# Patient Record
Sex: Female | Born: 2000 | Race: White | Hispanic: No | Marital: Single | Smoking: Never smoker
Health system: Southern US, Community
[De-identification: ages and names within clinical notes are randomized; demographics above are authoritative.]

## PROBLEM LIST (undated history)

## (undated) DIAGNOSIS — J45909 Unspecified asthma, uncomplicated: Secondary | ICD-10-CM

## (undated) DIAGNOSIS — M329 Systemic lupus erythematosus, unspecified: Secondary | ICD-10-CM

## (undated) DIAGNOSIS — IMO0002 Reserved for concepts with insufficient information to code with codable children: Secondary | ICD-10-CM

## (undated) HISTORY — DX: Unspecified asthma, uncomplicated: J45.909

## (undated) HISTORY — DX: Systemic lupus erythematosus, unspecified: M32.9

## (undated) HISTORY — DX: Reserved for concepts with insufficient information to code with codable children: IMO0002

---
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
## 2018-01-11 ENCOUNTER — Encounter: Payer: Self-pay | Admitting: Physical Therapy

## 2018-01-11 ENCOUNTER — Ambulatory Visit: Payer: BLUE CROSS/BLUE SHIELD | Attending: Orthopaedic Surgery | Admitting: Physical Therapy

## 2018-01-11 ENCOUNTER — Other Ambulatory Visit: Payer: Self-pay

## 2018-01-11 DIAGNOSIS — M25612 Stiffness of left shoulder, not elsewhere classified: Secondary | ICD-10-CM

## 2018-01-11 DIAGNOSIS — M25662 Stiffness of left knee, not elsewhere classified: Secondary | ICD-10-CM | POA: Insufficient documentation

## 2018-01-11 DIAGNOSIS — R29898 Other symptoms and signs involving the musculoskeletal system: Secondary | ICD-10-CM | POA: Insufficient documentation

## 2018-01-11 DIAGNOSIS — M25512 Pain in left shoulder: Secondary | ICD-10-CM | POA: Insufficient documentation

## 2018-01-11 DIAGNOSIS — R262 Difficulty in walking, not elsewhere classified: Secondary | ICD-10-CM | POA: Insufficient documentation

## 2018-01-11 DIAGNOSIS — M542 Cervicalgia: Secondary | ICD-10-CM | POA: Diagnosis present

## 2018-01-11 DIAGNOSIS — M25562 Pain in left knee: Secondary | ICD-10-CM | POA: Insufficient documentation
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
## 2018-01-17 ENCOUNTER — Ambulatory Visit: Payer: BLUE CROSS/BLUE SHIELD

## 2018-01-20 ENCOUNTER — Ambulatory Visit: Payer: BLUE CROSS/BLUE SHIELD | Attending: Orthopaedic Surgery | Admitting: Physical Therapy

## 2018-01-20 ENCOUNTER — Encounter: Payer: Self-pay | Admitting: Physical Therapy

## 2018-01-20 DIAGNOSIS — M25612 Stiffness of left shoulder, not elsewhere classified: Secondary | ICD-10-CM | POA: Insufficient documentation

## 2018-01-20 DIAGNOSIS — M542 Cervicalgia: Secondary | ICD-10-CM | POA: Insufficient documentation

## 2018-01-20 DIAGNOSIS — M25662 Stiffness of left knee, not elsewhere classified: Secondary | ICD-10-CM | POA: Insufficient documentation

## 2018-01-20 DIAGNOSIS — R29898 Other symptoms and signs involving the musculoskeletal system: Secondary | ICD-10-CM

## 2018-01-20 DIAGNOSIS — R262 Difficulty in walking, not elsewhere classified: Secondary | ICD-10-CM | POA: Insufficient documentation

## 2018-01-20 DIAGNOSIS — M25512 Pain in left shoulder: Secondary | ICD-10-CM | POA: Insufficient documentation

## 2018-01-20 DIAGNOSIS — M25562 Pain in left knee: Secondary | ICD-10-CM | POA: Insufficient documentation
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
## 2018-01-21 ENCOUNTER — Telehealth (INDEPENDENT_AMBULATORY_CARE_PROVIDER_SITE_OTHER): Payer: Self-pay | Admitting: Orthopaedic Surgery

## 2018-01-21 NOTE — Telephone Encounter (Signed)
Pt is stating that she can't go back to work and pick up children ect. 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

## 2018-01-24 NOTE — Telephone Encounter (Signed)
Note ready at front desk for pick up. I left voicemail for patient advising.
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
## 2018-01-24 NOTE — Telephone Encounter (Signed)
Ok for 1 month?

## 2018-01-26 ENCOUNTER — Ambulatory Visit: Payer: BLUE CROSS/BLUE SHIELD

## 2018-01-26 DIAGNOSIS — M25662 Stiffness of left knee, not elsewhere classified: Secondary | ICD-10-CM

## 2018-01-26 DIAGNOSIS — M25562 Pain in left knee: Secondary | ICD-10-CM

## 2018-01-26 DIAGNOSIS — R29898 Other symptoms and signs involving the musculoskeletal system: Secondary | ICD-10-CM

## 2018-01-26 DIAGNOSIS — M25512 Pain in left shoulder: Secondary | ICD-10-CM

## 2018-01-26 DIAGNOSIS — R262 Difficulty in walking, not elsewhere classified: Secondary | ICD-10-CM

## 2018-01-26 DIAGNOSIS — M542 Cervicalgia: Secondary | ICD-10-CM

## 2018-01-26 DIAGNOSIS — M25612 Stiffness of left shoulder, not elsewhere classified: Secondary | ICD-10-CM
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
## 2018-01-27 ENCOUNTER — Encounter: Payer: BLUE CROSS/BLUE SHIELD | Admitting: Physical Therapy

## 2018-01-28 ENCOUNTER — Ambulatory Visit: Payer: BLUE CROSS/BLUE SHIELD | Admitting: Physical Therapy

## 2018-02-01 ENCOUNTER — Ambulatory Visit: Payer: BLUE CROSS/BLUE SHIELD

## 2018-02-01 DIAGNOSIS — M25562 Pain in left knee: Secondary | ICD-10-CM

## 2018-02-01 DIAGNOSIS — M25512 Pain in left shoulder: Secondary | ICD-10-CM

## 2018-02-01 DIAGNOSIS — R29898 Other symptoms and signs involving the musculoskeletal system: Secondary | ICD-10-CM

## 2018-02-01 DIAGNOSIS — M25662 Stiffness of left knee, not elsewhere classified: Secondary | ICD-10-CM

## 2018-02-01 DIAGNOSIS — R262 Difficulty in walking, not elsewhere classified: Secondary | ICD-10-CM

## 2018-02-01 DIAGNOSIS — M25612 Stiffness of left shoulder, not elsewhere classified: Secondary | ICD-10-CM

## 2018-02-01 DIAGNOSIS — M542 Cervicalgia: Secondary | ICD-10-CM
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
## 2018-02-07 ENCOUNTER — Encounter: Payer: Self-pay | Admitting: Physical Therapy

## 2018-02-07 ENCOUNTER — Ambulatory Visit: Payer: BLUE CROSS/BLUE SHIELD | Admitting: Physical Therapy

## 2018-02-07 DIAGNOSIS — M542 Cervicalgia: Secondary | ICD-10-CM

## 2018-02-07 DIAGNOSIS — M25662 Stiffness of left knee, not elsewhere classified: Secondary | ICD-10-CM

## 2018-02-07 DIAGNOSIS — M25612 Stiffness of left shoulder, not elsewhere classified: Secondary | ICD-10-CM

## 2018-02-07 DIAGNOSIS — R262 Difficulty in walking, not elsewhere classified: Secondary | ICD-10-CM

## 2018-02-07 DIAGNOSIS — M25562 Pain in left knee: Secondary | ICD-10-CM

## 2018-02-07 DIAGNOSIS — M25512 Pain in left shoulder: Secondary | ICD-10-CM

## 2018-02-07 DIAGNOSIS — R29898 Other symptoms and signs involving the musculoskeletal system: Secondary | ICD-10-CM
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
## 2018-02-09 ENCOUNTER — Telehealth (INDEPENDENT_AMBULATORY_CARE_PROVIDER_SITE_OTHER): Payer: Self-pay | Admitting: Orthopaedic Surgery

## 2018-02-09 ENCOUNTER — Ambulatory Visit: Payer: BLUE CROSS/BLUE SHIELD | Admitting: Physical Therapy

## 2018-02-09 ENCOUNTER — Encounter: Payer: Self-pay | Admitting: Physical Therapy

## 2018-02-09 DIAGNOSIS — R262 Difficulty in walking, not elsewhere classified: Secondary | ICD-10-CM

## 2018-02-09 DIAGNOSIS — M542 Cervicalgia: Secondary | ICD-10-CM

## 2018-02-09 DIAGNOSIS — M25562 Pain in left knee: Secondary | ICD-10-CM

## 2018-02-09 DIAGNOSIS — M25662 Stiffness of left knee, not elsewhere classified: Secondary | ICD-10-CM

## 2018-02-09 DIAGNOSIS — M25612 Stiffness of left shoulder, not elsewhere classified: Secondary | ICD-10-CM

## 2018-02-09 DIAGNOSIS — R29898 Other symptoms and signs involving the musculoskeletal system: Secondary | ICD-10-CM

## 2018-02-09 DIAGNOSIS — M25512 Pain in left shoulder: Secondary | ICD-10-CM

## 2018-02-09 NOTE — Telephone Encounter (Signed)
Please advise 

## 2018-02-09 NOTE — Telephone Encounter (Signed)
Patient called stated that she has completed 6 sessions of PT and she is not feeling any relief it actually about the same pain she had prior to starting.  Please call patient to advise. She was stating she thinks having a MRI would be the next thing to do as that was discussed by the nurse.  [phone]
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
## 2018-02-09 NOTE — Telephone Encounter (Signed)
MRI of which body part?

## 2018-02-10 NOTE — Telephone Encounter (Signed)
LMOM for patient to call back with body part

## 2018-02-14 ENCOUNTER — Encounter: Payer: Self-pay | Admitting: Physical Therapy

## 2018-02-14 ENCOUNTER — Ambulatory Visit: Payer: BLUE CROSS/BLUE SHIELD | Attending: Orthopaedic Surgery | Admitting: Physical Therapy

## 2018-02-14 DIAGNOSIS — R29898 Other symptoms and signs involving the musculoskeletal system: Secondary | ICD-10-CM

## 2018-02-14 DIAGNOSIS — M25512 Pain in left shoulder: Secondary | ICD-10-CM

## 2018-02-14 DIAGNOSIS — M25662 Stiffness of left knee, not elsewhere classified: Secondary | ICD-10-CM

## 2018-02-14 DIAGNOSIS — M25562 Pain in left knee: Secondary | ICD-10-CM | POA: Insufficient documentation

## 2018-02-14 DIAGNOSIS — M542 Cervicalgia: Secondary | ICD-10-CM | POA: Insufficient documentation

## 2018-02-14 DIAGNOSIS — R262 Difficulty in walking, not elsewhere classified: Secondary | ICD-10-CM | POA: Insufficient documentation

## 2018-02-14 DIAGNOSIS — M25612 Stiffness of left shoulder, not elsewhere classified: Secondary | ICD-10-CM | POA: Insufficient documentation
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
## 2018-02-18 ENCOUNTER — Ambulatory Visit: Payer: BLUE CROSS/BLUE SHIELD

## 2018-02-23 ENCOUNTER — Telehealth (INDEPENDENT_AMBULATORY_CARE_PROVIDER_SITE_OTHER): Payer: Self-pay | Admitting: Orthopaedic Surgery

## 2018-02-23 ENCOUNTER — Ambulatory Visit (INDEPENDENT_AMBULATORY_CARE_PROVIDER_SITE_OTHER): Payer: BLUE CROSS/BLUE SHIELD | Admitting: Orthopaedic Surgery

## 2018-02-23 ENCOUNTER — Other Ambulatory Visit (INDEPENDENT_AMBULATORY_CARE_PROVIDER_SITE_OTHER): Payer: Self-pay

## 2018-02-23 DIAGNOSIS — M25562 Pain in left knee: Secondary | ICD-10-CM

## 2018-02-23 NOTE — Telephone Encounter (Signed)
MRI order made.   

## 2018-02-23 NOTE — Telephone Encounter (Signed)
Pt called asking about her MRI and if it is ready to be scheduled.

## 2018-02-23 NOTE — Telephone Encounter (Signed)
See message from patient, do you want to still see her and re exam?  Or do you want to just order MRI scan, then followup?

## 2018-02-23 NOTE — Telephone Encounter (Signed)
IC patient and advised, we will cancel today's appt and she can be seen to review MRI scan, she understands.  Need to enter order for MRI.

## 2018-02-23 NOTE — Telephone Encounter (Signed)
SEE OTHER MESSAGE

## 2018-02-23 NOTE — Telephone Encounter (Signed)
Patient called stating that she did call back to let the Dr. Roda Shutters know that it was her left knee that is still bothering her and that she thought an MRI would be the next step since PT is not helping her.  She wanted to know if she needed to come to the appointment today since she has not had the MRI yet.  CB#[phone].  Thank you.

## 2018-02-23 NOTE — Telephone Encounter (Signed)
Called patient to let her know. Someone will call her to schedule appt.

## 2018-02-23 NOTE — Telephone Encounter (Signed)
No she can reschedule until we get the MRI.  Go ahead and order the MRI please.  Thanks.

## 2018-02-24 ENCOUNTER — Encounter: Payer: Self-pay | Admitting: Physical Therapy

## 2018-02-24 ENCOUNTER — Ambulatory Visit: Payer: BLUE CROSS/BLUE SHIELD | Admitting: Physical Therapy

## 2018-02-24 DIAGNOSIS — M542 Cervicalgia: Secondary | ICD-10-CM

## 2018-02-24 DIAGNOSIS — M25612 Stiffness of left shoulder, not elsewhere classified: Secondary | ICD-10-CM

## 2018-02-24 DIAGNOSIS — M25662 Stiffness of left knee, not elsewhere classified: Secondary | ICD-10-CM

## 2018-02-24 DIAGNOSIS — R262 Difficulty in walking, not elsewhere classified: Secondary | ICD-10-CM

## 2018-02-24 DIAGNOSIS — M25562 Pain in left knee: Secondary | ICD-10-CM

## 2018-02-24 DIAGNOSIS — R29898 Other symptoms and signs involving the musculoskeletal system: Secondary | ICD-10-CM

## 2018-02-24 DIAGNOSIS — M25512 Pain in left shoulder: Secondary | ICD-10-CM
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
## 2018-03-01 ENCOUNTER — Ambulatory Visit: Payer: BLUE CROSS/BLUE SHIELD | Admitting: Physical Therapy

## 2018-03-01 ENCOUNTER — Encounter: Payer: Self-pay | Admitting: Physical Therapy

## 2018-03-01 ENCOUNTER — Telehealth (INDEPENDENT_AMBULATORY_CARE_PROVIDER_SITE_OTHER): Payer: Self-pay | Admitting: Orthopaedic Surgery

## 2018-03-01 DIAGNOSIS — M25662 Stiffness of left knee, not elsewhere classified: Secondary | ICD-10-CM

## 2018-03-01 DIAGNOSIS — M25512 Pain in left shoulder: Secondary | ICD-10-CM

## 2018-03-01 DIAGNOSIS — M542 Cervicalgia: Secondary | ICD-10-CM

## 2018-03-01 DIAGNOSIS — M25562 Pain in left knee: Secondary | ICD-10-CM

## 2018-03-01 DIAGNOSIS — R29898 Other symptoms and signs involving the musculoskeletal system: Secondary | ICD-10-CM

## 2018-03-01 DIAGNOSIS — R262 Difficulty in walking, not elsewhere classified: Secondary | ICD-10-CM

## 2018-03-01 DIAGNOSIS — M25612 Stiffness of left shoulder, not elsewhere classified: Secondary | ICD-10-CM

## 2018-03-01 NOTE — Telephone Encounter (Signed)
Ok that's fine
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
## 2018-03-01 NOTE — Telephone Encounter (Signed)
Pt called saying she needs a new note for work she is still experiencing some pain   and she doesn't have her MRI till the 26th of feb

## 2018-03-01 NOTE — Telephone Encounter (Signed)
See message.

## 2018-03-02 ENCOUNTER — Ambulatory Visit (INDEPENDENT_AMBULATORY_CARE_PROVIDER_SITE_OTHER): Payer: BLUE CROSS/BLUE SHIELD | Admitting: Orthopaedic Surgery

## 2018-03-04 ENCOUNTER — Ambulatory Visit: Payer: BLUE CROSS/BLUE SHIELD

## 2018-03-04 NOTE — Telephone Encounter (Signed)
I tried calling patient to discuss what she was needing note to cover. LM for her to call back to discuss.

## 2018-03-07 ENCOUNTER — Ambulatory Visit: Payer: BLUE CROSS/BLUE SHIELD | Admitting: Physical Therapy

## 2018-03-09 ENCOUNTER — Ambulatory Visit
Admission: RE | Admit: 2018-03-09 | Discharge: 2018-03-09 | Disposition: A | Discharge status: Home / Self Care | Payer: Self-pay | Source: Ambulatory Visit | Attending: Orthopaedic Surgery | Admitting: Orthopaedic Surgery

## 2018-03-09 DIAGNOSIS — M25562 Pain in left knee: Secondary | ICD-10-CM

## 2018-03-09 NOTE — Progress Notes (Signed)
MRI is completely normal.

## 2018-03-10 ENCOUNTER — Telehealth (INDEPENDENT_AMBULATORY_CARE_PROVIDER_SITE_OTHER): Payer: Self-pay

## 2018-03-10 NOTE — Telephone Encounter (Signed)
Patient returned your call.  CB#[phone].  Thank you.

## 2018-03-10 NOTE — Telephone Encounter (Signed)
Tarry Kos, MD  Albertina Parr, RMA        MRI is completely normal     Called patient no answer. LMOM to return our call.

## 2018-03-10 NOTE — Telephone Encounter (Signed)
Called to advise patient MRI was Normal.

## 2018-03-15 ENCOUNTER — Encounter: Payer: Self-pay | Admitting: Physical Therapy

## 2018-03-15 ENCOUNTER — Ambulatory Visit: Payer: BLUE CROSS/BLUE SHIELD | Attending: Orthopaedic Surgery | Admitting: Physical Therapy

## 2018-03-15 DIAGNOSIS — M25512 Pain in left shoulder: Secondary | ICD-10-CM

## 2018-03-15 DIAGNOSIS — R29898 Other symptoms and signs involving the musculoskeletal system: Secondary | ICD-10-CM

## 2018-03-15 DIAGNOSIS — M25562 Pain in left knee: Secondary | ICD-10-CM | POA: Diagnosis not present

## 2018-03-15 DIAGNOSIS — M542 Cervicalgia: Secondary | ICD-10-CM | POA: Insufficient documentation

## 2018-03-15 DIAGNOSIS — R262 Difficulty in walking, not elsewhere classified: Secondary | ICD-10-CM | POA: Diagnosis present

## 2018-03-15 DIAGNOSIS — M25662 Stiffness of left knee, not elsewhere classified: Secondary | ICD-10-CM | POA: Insufficient documentation

## 2018-03-15 DIAGNOSIS — M25612 Stiffness of left shoulder, not elsewhere classified: Secondary | ICD-10-CM
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
## 2018-03-15 NOTE — Patient Instructions (Signed)

## 2018-03-22 ENCOUNTER — Ambulatory Visit: Payer: BLUE CROSS/BLUE SHIELD | Admitting: Physical Therapy

## 2018-03-23 ENCOUNTER — Encounter: Payer: Self-pay | Admitting: Physical Therapy

## 2018-03-23 ENCOUNTER — Other Ambulatory Visit: Payer: Self-pay

## 2018-03-23 ENCOUNTER — Ambulatory Visit: Payer: BLUE CROSS/BLUE SHIELD | Admitting: Physical Therapy

## 2018-03-23 DIAGNOSIS — M25612 Stiffness of left shoulder, not elsewhere classified: Secondary | ICD-10-CM

## 2018-03-23 DIAGNOSIS — M542 Cervicalgia: Secondary | ICD-10-CM

## 2018-03-23 DIAGNOSIS — M25512 Pain in left shoulder: Secondary | ICD-10-CM

## 2018-03-23 DIAGNOSIS — M25662 Stiffness of left knee, not elsewhere classified: Secondary | ICD-10-CM

## 2018-03-23 DIAGNOSIS — M25562 Pain in left knee: Secondary | ICD-10-CM | POA: Diagnosis not present

## 2018-03-23 DIAGNOSIS — R262 Difficulty in walking, not elsewhere classified: Secondary | ICD-10-CM

## 2018-03-23 DIAGNOSIS — R29898 Other symptoms and signs involving the musculoskeletal system: Secondary | ICD-10-CM
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
## 2018-03-28 ENCOUNTER — Other Ambulatory Visit: Payer: Self-pay

## 2018-03-28 ENCOUNTER — Encounter: Payer: Self-pay | Admitting: Physical Therapy

## 2018-03-28 ENCOUNTER — Telehealth (INDEPENDENT_AMBULATORY_CARE_PROVIDER_SITE_OTHER): Payer: Self-pay | Admitting: Orthopaedic Surgery

## 2018-03-28 ENCOUNTER — Ambulatory Visit: Payer: BLUE CROSS/BLUE SHIELD | Admitting: Physical Therapy

## 2018-03-28 ENCOUNTER — Ambulatory Visit (INDEPENDENT_AMBULATORY_CARE_PROVIDER_SITE_OTHER): Payer: BLUE CROSS/BLUE SHIELD | Admitting: Orthopaedic Surgery

## 2018-03-28 ENCOUNTER — Encounter (INDEPENDENT_AMBULATORY_CARE_PROVIDER_SITE_OTHER): Payer: Self-pay | Admitting: Orthopaedic Surgery

## 2018-03-28 VITALS — BP 103/77 | HR 90 | Ht 63.5 in | Wt 165.0 lb

## 2018-03-28 DIAGNOSIS — R262 Difficulty in walking, not elsewhere classified: Secondary | ICD-10-CM

## 2018-03-28 DIAGNOSIS — M542 Cervicalgia: Secondary | ICD-10-CM

## 2018-03-28 DIAGNOSIS — M25562 Pain in left knee: Secondary | ICD-10-CM | POA: Diagnosis not present

## 2018-03-28 DIAGNOSIS — M25662 Stiffness of left knee, not elsewhere classified: Secondary | ICD-10-CM

## 2018-03-28 DIAGNOSIS — G8929 Other chronic pain: Secondary | ICD-10-CM | POA: Diagnosis not present

## 2018-03-28 DIAGNOSIS — M25512 Pain in left shoulder: Secondary | ICD-10-CM

## 2018-03-28 DIAGNOSIS — R29898 Other symptoms and signs involving the musculoskeletal system: Secondary | ICD-10-CM

## 2018-03-28 DIAGNOSIS — M25612 Stiffness of left shoulder, not elsewhere classified: Secondary | ICD-10-CM

## 2018-03-28 MED ORDER — DICLOFENAC SODIUM 1 % TD GEL: 4.0000 g | Freq: Four times a day (QID) | TRANSDERMAL | 4 refills | Status: DC

## 2018-03-28 NOTE — Telephone Encounter (Signed)
Patient came in today looking to pick up a out of work note from a message she left on 03/01/18. She states she was out of work from 02/24/18 until after her mri. Please call patient asap.

## 2018-03-28 NOTE — Telephone Encounter (Signed)
Is this [facility] note okay to do for the following dates?

## 2018-03-28 NOTE — Telephone Encounter (Signed)
yes
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
## 2018-03-29 ENCOUNTER — Encounter (INDEPENDENT_AMBULATORY_CARE_PROVIDER_SITE_OTHER): Payer: Self-pay

## 2018-03-29 NOTE — Telephone Encounter (Signed)
Called patient no answer LMOM note ready for pick up at the front desk.

## 2018-03-31 ENCOUNTER — Telehealth (INDEPENDENT_AMBULATORY_CARE_PROVIDER_SITE_OTHER): Payer: Self-pay | Admitting: Orthopaedic Surgery

## 2018-03-31 NOTE — Telephone Encounter (Signed)
Patient left a voicemail stating she received a call from her pharmacy who told her the Voltaren Gel needs a prior authorization before they will fill it.

## 2018-04-04 ENCOUNTER — Ambulatory Visit: Payer: BLUE CROSS/BLUE SHIELD

## 2018-04-08 NOTE — Telephone Encounter (Signed)
Called patient. No answer. Left message that insurance will not cover Voltaren Gel. I recommended that she use the GoodRx to get it and find it cheaper.

## 2018-04-11 ENCOUNTER — Encounter: Payer: BLUE CROSS/BLUE SHIELD | Admitting: Physical Therapy

## 2018-05-09 ENCOUNTER — Ambulatory Visit (INDEPENDENT_AMBULATORY_CARE_PROVIDER_SITE_OTHER): Payer: BLUE CROSS/BLUE SHIELD | Admitting: Orthopaedic Surgery

## 2018-08-16 ENCOUNTER — Telehealth: Payer: Self-pay | Admitting: Orthopaedic Surgery

## 2018-08-16 NOTE — Telephone Encounter (Signed)
Patient called stating she was attending physical therapy until their office closed due to [place].  Patient states she called to reschedule the appointments and was told she needed a new referral.  Patient is requesting a return call.

## 2018-08-17 ENCOUNTER — Other Ambulatory Visit: Payer: Self-pay | Admitting: Orthopaedic Surgery

## 2018-08-17 DIAGNOSIS — M25562 Pain in left knee: Secondary | ICD-10-CM

## 2018-08-17 DIAGNOSIS — G8929 Other chronic pain: Secondary | ICD-10-CM

## 2018-08-17 NOTE — Telephone Encounter (Signed)
Ok to renew referral?

## 2018-08-17 NOTE — Telephone Encounter (Signed)
Order has been entered and sent.

## 2018-08-17 NOTE — Telephone Encounter (Signed)
Are you okay with an order for PT for her left knee? It was originally ordered by Dr.Xu, but now patient is under your care for her left knee. Thanks!

## 2020-04-24 ENCOUNTER — Ambulatory Visit: Payer: BLUE CROSS/BLUE SHIELD | Admitting: Family Medicine

## 2020-05-08 ENCOUNTER — Encounter: Payer: Self-pay | Admitting: Family Medicine

## 2020-05-08 ENCOUNTER — Other Ambulatory Visit: Payer: Self-pay

## 2020-05-08 ENCOUNTER — Ambulatory Visit: Payer: 59 | Admitting: Family Medicine

## 2020-05-08 VITALS — BP 108/62 | HR 88 | Temp 98.0°F | Ht 63.5 in | Wt 216.0 lb

## 2020-05-08 DIAGNOSIS — Z0000 Encounter for general adult medical examination without abnormal findings: Secondary | ICD-10-CM

## 2020-05-08 DIAGNOSIS — J4520 Mild intermittent asthma, uncomplicated: Secondary | ICD-10-CM | POA: Diagnosis not present

## 2020-05-08 DIAGNOSIS — Z114 Encounter for screening for human immunodeficiency virus [HIV]: Secondary | ICD-10-CM | POA: Diagnosis not present

## 2020-05-08 DIAGNOSIS — Z1159 Encounter for screening for other viral diseases: Secondary | ICD-10-CM | POA: Diagnosis not present

## 2020-05-08 DIAGNOSIS — M329 Systemic lupus erythematosus, unspecified: Secondary | ICD-10-CM | POA: Diagnosis not present

## 2020-05-08 DIAGNOSIS — R635 Abnormal weight gain: Secondary | ICD-10-CM

## 2020-05-08 MED ORDER — ALBUTEROL SULFATE HFA 108 (90 BASE) MCG/ACT IN AERS: 2.0000 | INHALATION_SPRAY | Freq: Four times a day (QID) | RESPIRATORY_TRACT | 1 refills | Status: AC | PRN

## 2020-05-08 NOTE — Progress Notes (Signed)
Chief Complaint  Patient presents with  . New Patient (Initial Visit)     Well Woman Laura Warner is here for a complete physical.   Her last physical was >1 year ago.  Current diet: in general, a "healthy" diet. Current exercise: cardio, lifting weights. Fatigue out of ordinary? No Seatbelt? Yes Loss of interested in doing things or depression in past 2 weeks? No  Health Maintenance Pap/HPV- No Tetanus- Yes HIV screening- No Hep C screening- No  Patient has a history of weight gain over the past several years.  She has gained around 30 pounds from her regular weight.  Her diet is largely unchanged and remains healthy.  She exercises routinely lifting weights 2 days a week and doing cardio several days a week between.  Her brother has a history of thyroid disease.  She has never been tested.  The patient has a history of mild intermittent asthma.  She uses albuterol as needed.  Winter seems to be her worst time of the year where she uses her rescue inhaler around twice per week. Past Medical History:  Diagnosis Date  . Asthma   . Lupus [facility])      Past Surgical History:  Procedure Laterality Date  . TONSILLECTOMY AND ADENOIDECTOMY  2013  . wisdom  2018    Medications  Current Outpatient Medications on File Prior to Visit  Medication Sig Dispense Refill  . desonide (DESOWEN) 0.05 % cream Apply 1 application topically 2 (two) times daily.    . hydroxychloroquine (PLAQUENIL) 200 MG tablet Take 200 mg by mouth daily.    . hydrOXYzine (ATARAX/VISTARIL) 25 MG tablet Take 1 tablet by mouth daily as needed.    . tacrolimus (PROTOPIC) 0.03 % ointment Apply topically 2 (two) times daily.    Marland Kitchen triamcinolone cream (KENALOG) 0.1 % Apply topically 2 (two) times daily.     Allergies No Known Allergies  Review of Systems: Constitutional:  +wt gain Eye:  no recent significant change in vision Ear/Nose/Mouth/Throat:  Ears:  no tinnitus or vertigo and no recent change in  hearing Nose/Mouth/Throat:  no complaints of nasal congestion, no sore throat Cardiovascular: no chest pain Respiratory:  no cough and no shortness of breath Gastrointestinal:  no abdominal pain, no change in bowel habits GU:  Female: negative for dysuria or pelvic pain Musculoskeletal/Extremities:  no pain of the joints Integumentary (Skin/Breast):  no abnormal skin lesions reported Neurologic:  no headaches Endocrine:  denies fatigue Hematologic/Lymphatic:  No areas of easy bleeding  Exam BP 108/62 (BP Location: Left Arm, Patient Position: Sitting, Cuff Size: Normal)   Pulse 88   Temp 98 F (36.7 C) (Oral)   Ht 5' 3.5" (1.613 m)   Wt 216 lb (98 kg)   SpO2 99%   BMI 37.66 kg/m  General:  well developed, well nourished, in no apparent distress Skin:  no significant moles, warts, or growths Head:  no masses, lesions, or tenderness Eyes:  pupils equal and round, sclera anicteric without injection Ears:  canals without lesions, TMs shiny without retraction, no obvious effusion, no erythema Nose:  nares patent, septum midline, mucosa normal, and no drainage or sinus tenderness Throat/Pharynx:  lips and gingiva without lesion; tongue and uvula midline; non-inflamed pharynx; no exudates or postnasal drainage Neck: neck supple without adenopathy, thyromegaly, or masses Lungs:  clear to auscultation, breath sounds equal bilaterally, no respiratory distress Cardio:  regular rate and rhythm, no bruits, no LE edema Abdomen:  abdomen soft, nontender; bowel sounds normal; no  masses or organomegaly Genital: Defer to GYN Musculoskeletal:  symmetrical muscle groups noted without atrophy or deformity Extremities:  no clubbing, cyanosis, or edema, no deformities, no skin discoloration Neuro:  gait normal; deep tendon reflexes normal and symmetric Psych: well oriented with normal range of affect and appropriate judgment/insight  Assessment and Plan  Well adult exam - Plan: CBC, Comprehensive  metabolic panel, Lipid panel  Encounter for hepatitis C screening test for low risk patient - Plan: Hepatitis C Antibody  Screening for HIV (human immunodeficiency virus) - Plan: HIV antibody (with reflex)  Weight gain - Plan: VITAMIN D 25 Hydroxy (Vit-D Deficiency, Fractures), TSH, T4, free  Mild intermittent asthma without complication - Plan: albuterol (VENTOLIN HFA) 108 (90 Base) MCG/ACT inhaler  Systemic lupus erythematosus, unspecified SLE type, unspecified organ involvement status (HCC) - Plan: hydrOXYzine (ATARAX/VISTARIL) 25 MG tablet, tacrolimus (PROTOPIC) 0.03 % ointment, triamcinolone cream (KENALOG) 0.1 %, desonide (DESOWEN) 0.05 % cream   Well 20 y.o. female. Counseled on diet and exercise. Other orders as above. Weight gain; check additional labs.  Recommended increasing protein and weight resistance exercise in her regimen.  Could consider weight loss clinic pending above results. Follow up in 1 year or as needed. The patient voiced understanding and agreement to the plan.  Jilda Roche [place], DO 05/08/20 3:37 PM

## 2020-05-08 NOTE — Patient Instructions (Signed)
Give [place] 2-3 business days to get the results of your labs back.   Keep the diet clean and stay active.

## 2020-05-08 NOTE — Addendum Note (Signed)
Addended by: Akiah Bauch A on: 05/08/2020 04:28 PM   Modules accepted: Orders  

## 2020-05-09 LAB — COMPREHENSIVE METABOLIC PANEL
ALT: 15 U/L (ref 0–35)
AST: 12 U/L (ref 0–37)
Albumin: 4.7 g/dL (ref 3.5–5.2)
Alkaline Phosphatase: 63 U/L (ref 39–117)
BUN: 14 mg/dL (ref 6–23)
CO2: 26 mEq/L (ref 19–32)
Calcium: 9.9 mg/dL (ref 8.4–10.5)
Chloride: 104 mEq/L (ref 96–112)
Creatinine, Ser: 0.71 mg/dL (ref 0.40–1.20)
GFR: 121.31 mL/min (ref >60.00)
Glucose, Bld: 84 mg/dL (ref 70–99)
Potassium: 4 mEq/L (ref 3.5–5.1)
Sodium: 140 mEq/L (ref 135–145)
Total Bilirubin: 0.4 mg/dL (ref 0.2–1.2)
Total Protein: 7.4 g/dL (ref 6.0–8.3)

## 2020-05-09 LAB — TSH: TSH: 2.13 u[IU]/mL (ref 0.35–4.50)

## 2020-05-09 LAB — LIPID PANEL
Cholesterol: 168 mg/dL (ref 0–200)
HDL: 51.5 mg/dL (ref >39.00)
LDL Cholesterol: 104 mg/dL — ABNORMAL HIGH (ref 0–99)
NonHDL: 116.01
Total CHOL/HDL Ratio: 3
Triglycerides: 60 mg/dL (ref 0.0–149.0)
VLDL: 12 mg/dL (ref 0.0–40.0)

## 2020-05-09 LAB — CBC
HCT: 39.9 % (ref 36.0–46.0)
Hemoglobin: 13.7 g/dL (ref 12.0–15.0)
MCHC: 34.3 g/dL (ref 30.0–36.0)
MCV: 82.8 fl (ref 78.0–100.0)
Platelets: 337 10*3/uL (ref 150.0–400.0)
RBC: 4.81 Mil/uL (ref 3.87–5.11)
RDW: 13.5 % (ref 11.5–15.5)
WBC: 5.5 10*3/uL (ref 4.0–10.5)

## 2020-05-09 LAB — T4, FREE: Free T4: 0.76 ng/dL (ref 0.60–1.60)

## 2020-05-09 LAB — HEPATITIS C ANTIBODY
Hepatitis C Ab: NONREACTIVE
SIGNAL TO CUT-OFF: 0 (ref <1.00)

## 2020-05-09 LAB — HIV ANTIBODY (ROUTINE TESTING W REFLEX): HIV 1&2 Ab, 4th Generation: NONREACTIVE

## 2020-05-09 LAB — VITAMIN D 25 HYDROXY (VIT D DEFICIENCY, FRACTURES): VITD: 37.2 ng/mL (ref 30.00–100.00)

## 2020-06-20 ENCOUNTER — Telehealth: Payer: 59 | Admitting: Physician Assistant

## 2020-06-20 DIAGNOSIS — J4541 Moderate persistent asthma with (acute) exacerbation: Secondary | ICD-10-CM

## 2020-06-20 MED ORDER — PREDNISONE 10 MG (21) PO TBPK: ORAL_TABLET | ORAL | 0 refills | Status: DC

## 2020-06-20 MED ORDER — BENZONATATE 100 MG PO CAPS: 100.0000 mg | ORAL_CAPSULE | Freq: Three times a day (TID) | ORAL | 0 refills | Status: DC | PRN

## 2020-06-20 NOTE — Progress Notes (Signed)
We are sorry that you are not feeling well.  Here is how we plan to help!  Based on your presentation I believe you most likely have A cough due to a virus.  This is called viral bronchitis and is best treated by rest, plenty of fluids and control of the cough.  You may use Ibuprofen or Tylenol as directed to help your symptoms.     In addition you may use A prescription cough medication called Tessalon Perles 100mg. You may take 1-2 capsules every 8 hours as needed for your cough.  Prednisone 10 mg daily for 6 days (see taper instructions below)  Directions for 6 day taper: Day 1: 2 tablets before breakfast, 1 after both lunch & dinner and 2 at bedtime Day 2: 1 tab before breakfast, 1 after both lunch & dinner and 2 at bedtime Day 3: 1 tab at each meal & 1 at bedtime Day 4: 1 tab at breakfast, 1 at lunch, 1 at bedtime Day 5: 1 tab at breakfast & 1 tab at bedtime Day 6: 1 tab at breakfast   From your responses in the eVisit questionnaire you describe inflammation in the upper respiratory tract which is causing a significant cough.  This is commonly called Bronchitis and has four common causes:    Allergies  Viral Infections  Acid Reflux  Bacterial Infection Allergies, viruses and acid reflux are treated by controlling symptoms or eliminating the cause. An example might be a cough caused by taking certain blood pressure medications. You stop the cough by changing the medication. Another example might be a cough caused by acid reflux. Controlling the reflux helps control the cough.  USE OF BRONCHODILATOR ("RESCUE") INHALERS: There is a risk from using your bronchodilator too frequently.  The risk is that over-reliance on a medication which only relaxes the muscles surrounding the breathing tubes can reduce the effectiveness of medications prescribed to reduce swelling and congestion of the tubes themselves.  Although you feel brief relief from the bronchodilator inhaler, your asthma may  actually be worsening with the tubes becoming more swollen and filled with mucus.  This can delay other crucial treatments, such as oral steroid medications. If you need to use a bronchodilator inhaler daily, several times per day, you should discuss this with your provider.  There are probably better treatments that could be used to keep your asthma under control.     HOME CARE . Only take medications as instructed by your medical team. . Complete the entire course of an antibiotic. . Drink plenty of fluids and get plenty of rest. . Avoid close contacts especially the very young and the elderly . Cover your mouth if you cough or cough into your sleeve. . Always remember to wash your hands . A steam or ultrasonic humidifier can help congestion.   GET HELP RIGHT AWAY IF: . You develop worsening fever. . You become short of breath . You cough up blood. . Your symptoms persist after you have completed your treatment plan MAKE SURE YOU   Understand these instructions.  Will watch your condition.  Will get help right away if you are not doing well or get worse.  Your e-visit answers were reviewed by a board certified advanced clinical practitioner to complete your personal care plan.  Depending on the condition, your plan could have included both over the counter or prescription medications. If there is a problem please reply  once you have received a response from your provider. Your   safety is important to us.  If you have drug allergies check your prescription carefully.    You can use MyChart to ask questions about today's visit, request a non-urgent call back, or ask for a work or school excuse for 24 hours related to this e-Visit. If it has been greater than 24 hours you will need to follow up with your provider, or enter a new e-Visit to address those concerns. You will get an e-mail in the next two days asking about your experience.  I hope that your e-visit has been valuable and will  speed your recovery. Thank you for using e-visits.  I provided 6 minutes of non face-to-face time during this encounter for chart review and documentation.   
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
## 2021-08-05 ENCOUNTER — Ambulatory Visit: Payer: BC Managed Care – PPO | Admitting: Family

## 2021-08-05 VITALS — BP 100/64 | HR 81 | Temp 98.2°F | Resp 16 | Ht 63.0 in | Wt 222.0 lb

## 2021-08-05 DIAGNOSIS — L309 Dermatitis, unspecified: Secondary | ICD-10-CM | POA: Diagnosis not present

## 2021-08-05 LAB — CBC WITH DIFFERENTIAL/PLATELET
Basophils Absolute: 0 10*3/uL (ref 0.0–0.1)
Basophils Relative: 0.8 % (ref 0.0–3.0)
Eosinophils Absolute: 0.1 10*3/uL (ref 0.0–0.7)
Eosinophils Relative: 2.1 % (ref 0.0–5.0)
HCT: 40 % (ref 36.0–46.0)
Hemoglobin: 13.4 g/dL (ref 12.0–15.0)
Lymphocytes Relative: 23.2 % (ref 12.0–46.0)
Lymphs Abs: 1.5 10*3/uL (ref 0.7–4.0)
MCHC: 33.7 g/dL (ref 30.0–36.0)
MCV: 83.9 fl (ref 78.0–100.0)
Monocytes Absolute: 0.5 10*3/uL (ref 0.1–1.0)
Monocytes Relative: 8.6 % (ref 3.0–12.0)
Neutro Abs: 4.1 10*3/uL (ref 1.4–7.7)
Neutrophils Relative %: 65.3 % (ref 43.0–77.0)
Platelets: 331 10*3/uL (ref 150.0–400.0)
RBC: 4.76 Mil/uL (ref 3.87–5.11)
RDW: 13.5 % (ref 11.5–15.5)
WBC: 6.3 10*3/uL (ref 4.0–10.5)

## 2021-08-05 MED ORDER — IBUPROFEN 800 MG PO TABS: 800.0000 mg | ORAL_TABLET | Freq: Three times a day (TID) | ORAL | 0 refills | Status: DC | PRN

## 2021-08-05 NOTE — Progress Notes (Signed)
Laura Warner is a 21 y.o. female with the following history as recorded in [employer]:  Patient Active Problem List   Diagnosis Date Noted   Neck pain 12/07/2017   Acute pain of left knee 12/07/2017   Chronic left shoulder pain 12/07/2017    Current Outpatient Medications  Medication Sig Dispense Refill   albuterol (VENTOLIN HFA) 108 (90 Base) MCG/ACT inhaler Inhale 2 puffs into the lungs every 6 (six) hours as needed for wheezing or shortness of breath. 8 g 1   desonide (DESOWEN) 0.05 % cream Apply 1 application topically 2 (two) times daily.     hydroxychloroquine (PLAQUENIL) 200 MG tablet Take 200 mg by mouth daily.     ibuprofen (ADVIL) 800 MG tablet Take 1 tablet (800 mg total) by mouth every 8 (eight) hours as needed. 30 tablet 0   tacrolimus (PROTOPIC) 0.03 % ointment Apply topically 2 (two) times daily.     triamcinolone cream (KENALOG) 0.1 % Apply topically 2 (two) times daily.     No current facility-administered medications for this visit.    Allergies: Doxycycline  Past Medical History:  Diagnosis Date   Asthma    Lupus (HCC)     Past Surgical History:  Procedure Laterality Date   TONSILLECTOMY AND ADENOIDECTOMY  2013   wisdom  2018    Family History  Problem Relation Age of Onset   Heart disease Maternal Grandfather    Kidney disease Paternal Grandfather    Diabetes Paternal Grandfather    ADD / ADHD Brother    Thyroid disease Brother    Hypertension Brother     Social History   Tobacco Use   Smoking status: Never   Smokeless tobacco: Never  Substance Use Topics   Alcohol use: Never    Subjective:   Insect bites on left lower leg; went to [facility] over the weekend and was started on Keflex; does feel that symptoms slowly improving but is concerned because she is running low grade fevers; has been on Keflex x 2 days;   LMP 2 weeks ago    Objective:  Vitals:   08/05/21 1113  BP: 100/64  Pulse: 81  Resp: 16  Temp: 98.2 F (36.8 C)  TempSrc: Oral   SpO2: 97%  Weight: 222 lb (100.7 kg)  Height: 5\' 3"  (1.6 m)    General: Well developed, well nourished, in no acute distress  Skin : Warm and dry. Scattered pustular lesions noted on lower left leg; no warmth or streaking;  Head: Normocephalic and atraumatic  Eyes: Sclera and conjunctiva clear; pupils round and reactive to light; extraocular movements intact  Ears: External normal; canals clear; tympanic membranes normal  Oropharynx: Pink, supple. No suspicious lesions  Neck: Supple without thyromegaly, adenopathy  Lungs: Respirations unlabored;  Neurologic: Alert and oriented; speech intact; face symmetrical; moves all extremities well; CNII-XII intact without focal deficit   Assessment:  1. Dermatitis     Plan:  Reassurance; will continue Keflex for now- do think areas of concerns are improving based on pictures she provides of original infection; Rx for Ibuprofen 800 mg tid; apply moist heat to area; check CBC today; follow up to be determined.   No follow-ups on file.  Orders Placed This Encounter  Procedures   CBC with Differential/Platelet    Requested Prescriptions   Signed Prescriptions Disp Refills   ibuprofen (ADVIL) 800 MG tablet 30 tablet 0    Sig: Take 1 tablet (800 mg total) by mouth every 8 (eight) hours as  needed.

## 2022-03-06 ENCOUNTER — Encounter: Payer: Self-pay | Admitting: Family Medicine

## 2022-03-06 ENCOUNTER — Ambulatory Visit: Payer: BC Managed Care – PPO | Admitting: Family Medicine

## 2022-03-06 VITALS — BP 120/79 | HR 81 | Temp 98.2°F | Ht 63.0 in | Wt 223.4 lb

## 2022-03-06 DIAGNOSIS — L0100 Impetigo, unspecified: Secondary | ICD-10-CM | POA: Diagnosis not present

## 2022-03-06 MED ORDER — SULFAMETHOXAZOLE-TRIMETHOPRIM 800-160 MG PO TABS: 1.0000 | ORAL_TABLET | Freq: Two times a day (BID) | ORAL | 0 refills | Status: DC

## 2022-03-06 NOTE — Progress Notes (Signed)
   Acute Office Visit  Subjective:     Patient ID: Laura Warner, female    DOB: Oct 21, 2000, 22 y.o.   MRN: [id number]  Chief Complaint  Patient presents with   Rash    Rash on both ring fingers x 2 weeks very itchy cream from dermatologist not helping.      Patient is in today for rash.  Patient with rash to bilateral 4th fingers for the past 1-2 weeks. States they are painful (6/10 pressure-type pain), inflammation, dry skin, occasionally red, sometimes clear/yellow drainage, itching. She denies any new jewelry or skin care products. States similar episode happened about a month ago and dermatology told her to try silvadene and clobetasol creams. States this episode is worse and not responding to the creams. Reports history of childhood eczema.     All review of systems negative except what is listed in the HPI      Objective:    BP 120/79 (BP Location: Right Arm, Patient Position: Sitting, Cuff Size: Large)   Pulse 81   Temp 98.2 F (36.8 C) (Oral)   Ht 5' 3"$  (1.6 m)   Wt 223 lb 6.4 oz (101.3 kg)   SpO2 98%   BMI 39.57 kg/m    Physical Exam Vitals reviewed.  Constitutional:      General: She is not in acute distress.    Appearance: Normal appearance. She is not ill-appearing.  Skin:    General: Skin is warm and dry.     Comments: Bilateral 4th fingers with inflammation, erythema, and honey-crust lesions; no warmth or drainage today, but has occurred at home per patient   Neurological:     Mental Status: She is alert.       No results found for any visits on 03/06/22.      Assessment & Plan:   Problem List Items Addressed This Visit   None Visit Diagnoses     Impetigo    -  Primary Honey-crust lesions concerning for Impetigo. Treating with antibiotics. Keep clean.  Possibly related to baseline eczema. Follow general measures listed below after infection is cleared. Follow-up with dermatology.   General Eczema measures:  - Use a mild, unscented soap  (Dove Sensitive Skin, Aveeno, Vanicream products). - Bathe every other day if possible. Use mildly warm water. Lightly pat dry, do not dry completely or rub skin. - After the bath apply fragrance-free moisturizer (Vaseline, Aveeno Eczema Therapy, Aquaphor, Eucerin, Vanicream, CeraVe cream, Cetaphil Restoraderm, [employer]). These moisturizers can be applied multiple times per day to keep the skin from looking dry.  -Use gentle, fragrance-free laundry detergents  - Use the steroid ointment as indicated for flares. Apply the steroid creams to the skin first, then after a few minutes, top with moisturizer from list above.      Relevant Medications   sulfamethoxazole-trimethoprim (BACTRIM DS) 800-160 MG tablet       Meds ordered this encounter  Medications   sulfamethoxazole-trimethoprim (BACTRIM DS) 800-160 MG tablet    Sig: Take 1 tablet by mouth 2 (two) times daily for 7 days.    Dispense:  14 tablet    Refill:  0    Order Specific Question:   Supervising Provider    Answer:   Penni Homans A [[id number]]    Return if symptoms worsen or fail to improve.  Terrilyn Saver, NP

## 2022-03-06 NOTE — Patient Instructions (Addendum)
Honey-crust lesions concerning for Impetigo. Treating with antibiotics. Keep clean.  Possibly related to baseline eczema. Follow general measures listed below after infection is cleared. Follow-up with dermatology.   General Eczema measures:  - Use a mild, unscented soap (Dove Sensitive Skin, Aveeno, Vanicream products). - Bathe every other day if possible. Use mildly warm water. Lightly pat dry, do not dry completely or rub skin. - After the bath apply fragrance-free moisturizer (Vaseline, Aveeno Eczema Therapy, Aquaphor, Eucerin, Vanicream, CeraVe cream, Cetaphil Restoraderm, [employer]). These moisturizers can be applied multiple times per day to keep the skin from looking dry.  -Use gentle, fragrance-free laundry detergents  - Use the steroid ointment as indicated for flares. Apply the steroid creams to the skin first, then after a few minutes, top with moisturizer from list above.

## 2022-03-07 ENCOUNTER — Emergency Department (HOSPITAL_BASED_OUTPATIENT_CLINIC_OR_DEPARTMENT_OTHER)
Admission: EM | Admit: 2022-03-07 | Discharge: 2022-03-07 | Disposition: A | Discharge status: Home / Self Care | Payer: BC Managed Care – PPO | Attending: Emergency Medicine | Admitting: Emergency Medicine

## 2022-03-07 ENCOUNTER — Other Ambulatory Visit: Payer: Self-pay

## 2022-03-07 DIAGNOSIS — T7840XA Allergy, unspecified, initial encounter: Secondary | ICD-10-CM

## 2022-03-07 DIAGNOSIS — Z7952 Long term (current) use of systemic steroids: Secondary | ICD-10-CM | POA: Diagnosis not present

## 2022-03-07 DIAGNOSIS — J45909 Unspecified asthma, uncomplicated: Secondary | ICD-10-CM | POA: Diagnosis not present

## 2022-03-07 DIAGNOSIS — L408 Other psoriasis: Secondary | ICD-10-CM | POA: Diagnosis not present

## 2022-03-07 DIAGNOSIS — L0100 Impetigo, unspecified: Secondary | ICD-10-CM | POA: Diagnosis not present

## 2022-03-07 DIAGNOSIS — Z7951 Long term (current) use of inhaled steroids: Secondary | ICD-10-CM | POA: Diagnosis not present

## 2022-03-07 DIAGNOSIS — L308 Other specified dermatitis: Secondary | ICD-10-CM

## 2022-03-07 DIAGNOSIS — T782XXA Anaphylactic shock, unspecified, initial encounter: Secondary | ICD-10-CM | POA: Diagnosis not present

## 2022-03-07 DIAGNOSIS — R21 Rash and other nonspecific skin eruption: Secondary | ICD-10-CM | POA: Diagnosis present

## 2022-03-07 MED ORDER — PREDNISONE 10 MG PO TABS: 20.0000 mg | ORAL_TABLET | Freq: Every day | ORAL | 0 refills | Status: AC

## 2022-03-07 MED ORDER — MUPIROCIN CALCIUM 2 % EX CREA: 1.0000 | TOPICAL_CREAM | Freq: Two times a day (BID) | CUTANEOUS | 0 refills | Status: AC

## 2022-03-07 MED ORDER — EPINEPHRINE 0.3 MG/0.3ML IJ SOAJ: 0.3000 mg | INTRAMUSCULAR | 0 refills | Status: AC | PRN

## 2022-03-07 MED ORDER — CEPHALEXIN 500 MG PO CAPS: 500.0000 mg | ORAL_CAPSULE | Freq: Three times a day (TID) | ORAL | 0 refills | Status: AC

## 2022-03-07 NOTE — ED Triage Notes (Signed)
Pt c/o hives since last night after starting Bactrim for a finger infection; fine, flesh-colored hives noted forehead and chest; had Benadryl last pm; NAD, resp even/unalbored

## 2022-03-07 NOTE — ED Provider Notes (Signed)
[facility] Provider Note   CSN: [id number] Arrival date & time: 03/07/22  1801     History {Add pertinent medical, surgical, social history, OB history to HPI:1} Chief Complaint  Patient presents with   Allergic Reaction    Laura Warner is a 22 y.o. female.  HPI     Took bactrim yesterday afternoon Took benadryl like normally does for lupus last night Then this morning woke up and had itching, rash across chest and face, 3 episodes of shortness of breath within 10 minutes, would start to calm down and then would come back and feel worse.  Felt like couldn't swallow normally, but no tongue swelling, lip swelling, no change in voice. Was gasping  lasted about 2-3 minutes each, lightheaded at the time, itchiness at that time.  No nausea, vomiting, abdominal pain, chest pain. No fever  Fingers 2 weeks ago  No asymmetric leg swelling No hx of dvt/pe  Hx of asthma and lupus Does not have epi pen yet No smoking, etoh or other drugs   Went to the dr for fingers, wasn't sure cause, treating for finger infection  Home Medications Prior to Admission medications   Medication Sig Start Date End Date Taking? Authorizing Provider  albuterol (VENTOLIN HFA) 108 (90 Base) MCG/ACT inhaler Inhale 2 puffs into the lungs every 6 (six) hours as needed for wheezing or shortness of breath. 05/08/20   Wendling, Crosby Oyster, DO  desonide (DESOWEN) 0.05 % cream Apply 1 application topically 2 (two) times daily.    [provider]  hydroxychloroquine (PLAQUENIL) 200 MG tablet Take 200 mg by mouth daily.    [provider]  sulfamethoxazole-trimethoprim (BACTRIM DS) 800-160 MG tablet Take 1 tablet by mouth 2 (two) times daily for 7 days. 03/06/22 03/13/22  Terrilyn Saver, NP  tacrolimus (PROTOPIC) 0.03 % ointment Apply topically 2 (two) times daily. 11/28/19   [provider]  triamcinolone cream (KENALOG) 0.1 % Apply topically 2  (two) times daily. 11/27/19   [provider]      Allergies    Bactrim [sulfamethoxazole-trimethoprim] and Doxycycline    Review of Systems   Review of Systems  Physical Exam Updated Vital Signs BP 122/89   Pulse 75   Temp 98.2 F (36.8 C) (Oral)   Resp 16   SpO2 100%  Physical Exam  ED Results / Procedures / Treatments   Labs (all labs ordered are listed, but only abnormal results are displayed) Labs Reviewed - No data to display  EKG None  Radiology No results found.  Procedures Procedures  {Document cardiac monitor, telemetry assessment procedure when appropriate:1}  Medications Ordered in ED Medications - No data to display  ED Course/ Medical Decision Making/ A&P   {   Click here for ABCD2, HEART and other calculatorsREFRESH Note before signing :1}                          Medical Decision Making  ***  {Document critical care time when appropriate:1} {Document review of labs and clinical decision tools ie heart score, Chads2Vasc2 etc:1}  {Document your independent review of radiology images, and any outside records:1} {Document your discussion with family members, caretakers, and with consultants:1} {Document social determinants of health affecting pt's care:1} {Document your decision making why or why not admission, treatments were needed:1} Final Clinical Impression(s) / ED Diagnoses Final diagnoses:  None    Rx / DC Orders ED  Discharge Orders     None

## 2022-03-09 ENCOUNTER — Telehealth: Payer: Self-pay | Admitting: *Deleted

## 2022-03-09 NOTE — Telephone Encounter (Signed)
Pt went to ER

## 2022-03-09 NOTE — Telephone Encounter (Signed)
Who Is Calling Patient / Member / Family / Caregiver Call Type Triage / Clinical Relationship To Patient Self Return Phone Number [phone] (Primary) Chief Complaint Medication reaction Reason for Call Symptomatic / Request for Health Information Initial Comment Caller states that she was given an antibiotic and now has hives on her face and chest. Caller states she saw Dr. Lovena Le yesterday. Translation No Nurse Assessment Nurse: Marcello Moores, RN, Noah Delaine Date/Time Eilene Ghazi Time): 03/07/2022 11:53:49 AM Confirm and document reason for call. If symptomatic, describe symptoms. ---Caller states that she started taking Bactrim DS yesterday for an infection on her fingers and now has hives on her face and chest. She has not taken her dose of Bactrim today. Denies SOB, swelling of her face or tongue. Denies fever.  Final Disposition 03/07/2022 12:00:56 PM See PCP within 24 Hours Yes Marcello Moores, RN, Noah Delaine Caller Disagree/Comply Comply Caller Understands Yes PreDisposition [place] Advice Given Per Guideline SEE PCP WITHIN 24 HOURS: * IF OFFICE WILL BE CLOSED: You need to be seen within the next 24 hours. A clinic or an urgent care center is often a good source of care if your doctor's office is closed or you can't get an appointment. CARE ADVICE given per Rash - Widespread on Drugs (Adult) guideline * You become worse CALL BACK IF: STOP THE MEDICINE: * Diphenhydramine (Benadryl) is a FIRST GENERATION ANTIHISTAMINE medicine. It can make you more sleepy than the newer second generation antihistamine medicines. The adult dose of Benadryl is 25 to 50 mg by mouth. You can take it up to 4 times a day. Comments User: Candie Chroman Date/Time Eilene Ghazi Time): 03/07/2022 11:43:26 AM Caller is unsure of zipcode Referrals GO TO FACILITY UNDECIDED

## 2022-09-01 DIAGNOSIS — R768 Other specified abnormal immunological findings in serum: Secondary | ICD-10-CM | POA: Diagnosis not present

## 2022-09-01 DIAGNOSIS — Z79899 Other long term (current) drug therapy: Secondary | ICD-10-CM | POA: Diagnosis not present

## 2022-09-01 DIAGNOSIS — R21 Rash and other nonspecific skin eruption: Secondary | ICD-10-CM | POA: Diagnosis not present

## 2022-09-01 DIAGNOSIS — M2550 Pain in unspecified joint: Secondary | ICD-10-CM | POA: Diagnosis not present

## 2022-11-25 DIAGNOSIS — M2550 Pain in unspecified joint: Secondary | ICD-10-CM | POA: Diagnosis not present

## 2022-11-25 DIAGNOSIS — Z79899 Other long term (current) drug therapy: Secondary | ICD-10-CM | POA: Diagnosis not present

## 2022-11-25 DIAGNOSIS — R768 Other specified abnormal immunological findings in serum: Secondary | ICD-10-CM | POA: Diagnosis not present

## 2023-01-13 ENCOUNTER — Telehealth: Payer: BC Managed Care – PPO | Admitting: Family Medicine

## 2023-01-13 DIAGNOSIS — J40 Bronchitis, not specified as acute or chronic: Secondary | ICD-10-CM

## 2023-01-13 MED ORDER — BENZONATATE 100 MG PO CAPS: 100.0000 mg | ORAL_CAPSULE | Freq: Three times a day (TID) | ORAL | 0 refills | Status: DC | PRN

## 2023-01-13 MED ORDER — PREDNISONE 10 MG (21) PO TBPK: ORAL_TABLET | ORAL | 0 refills | Status: DC

## 2023-01-13 MED ORDER — AZITHROMYCIN 250 MG PO TABS: ORAL_TABLET | ORAL | 0 refills | Status: AC

## 2023-01-13 NOTE — Progress Notes (Signed)
 E-Visit for Cough   We are sorry that you are not feeling well.  Here is how we plan to help!  Based on your presentation I believe you most likely have A cough due to bacteria.  When patients have a fever and a productive cough with a change in color or increased sputum production, we are concerned about bacterial bronchitis.  If left untreated it can progress to pneumonia.  If your symptoms do not improve with your treatment plan it is important that you contact your provider.   I have prescribed Azithromyin 250 mg: two tablets now and then one tablet daily for 4 additonal days    In addition you may use A prescription cough medication called Tessalon  Perles 100mg . You may take 1-2 capsules every 8 hours as needed for your cough.  Prednisone  10 mg daily for 6 days (please follow prescription instructions.)  From your responses in the eVisit questionnaire you describe inflammation in the upper respiratory tract which is causing a significant cough.  This is commonly called Bronchitis and has four common causes:   Allergies Viral Infections Acid Reflux Bacterial Infection Allergies, viruses and acid reflux are treated by controlling symptoms or eliminating the cause. An example might be a cough caused by taking certain blood pressure medications. You stop the cough by changing the medication. Another example might be a cough caused by acid reflux. Controlling the reflux helps control the cough.  USE OF BRONCHODILATOR (RESCUE) INHALERS: There is a risk from using your bronchodilator too frequently.  The risk is that over-reliance on a medication which only relaxes the muscles surrounding the breathing tubes can reduce the effectiveness of medications prescribed to reduce swelling and congestion of the tubes themselves.  Although you feel brief relief from the bronchodilator inhaler, your asthma may actually be worsening with the tubes becoming more swollen and filled with mucus.  This can delay  other crucial treatments, such as oral steroid medications. If you need to use a bronchodilator inhaler daily, several times per day, you should discuss this with your provider.  There are probably better treatments that could be used to keep your asthma under control.     HOME CARE Only take medications as instructed by your medical team. Complete the entire course of an antibiotic. Drink plenty of fluids and get plenty of rest. Avoid close contacts especially the very young and the elderly Cover your mouth if you cough or cough into your sleeve. Always remember to wash your hands A steam or ultrasonic humidifier can help congestion.   GET HELP RIGHT AWAY IF: You develop worsening fever. You become short of breath You cough up blood. Your symptoms persist after you have completed your treatment plan MAKE SURE YOU  Understand these instructions. Will watch your condition. Will get help right away if you are not doing well or get worse.    Thank you for choosing an e-visit.  Your e-visit answers were reviewed by a board certified advanced clinical practitioner to complete your personal care plan. Depending upon the condition, your plan could have included both over the counter or prescription medications.  Please review your pharmacy choice. Make sure the pharmacy is open so you can pick up prescription now. If there is a problem, you may contact your provider through [employer] and have the prescription routed to another pharmacy.  Your safety is important to us . If you have drug allergies check your prescription carefully.   For the next 24 hours you  can use MyChart to ask questions about today's visit, request a non-urgent call back, or ask for a work or school excuse. You will get an email in the next two days asking about your experience. I hope that your e-visit has been valuable and will speed your recovery.  I have spent 5 minutes in review of e-visit questionnaire,  review and updating patient chart, medical decision making and response to patient.   Roosvelt Mater, PA-C

## 2023-01-18 ENCOUNTER — Encounter: Payer: Self-pay | Admitting: Family Medicine

## 2023-01-19 ENCOUNTER — Telehealth: Payer: Self-pay | Admitting: Family Medicine

## 2023-01-19 NOTE — Telephone Encounter (Signed)
 Vm left to see if pt would rather see pcp tmr instead of [facility].

## 2023-01-20 ENCOUNTER — Ambulatory Visit (HOSPITAL_BASED_OUTPATIENT_CLINIC_OR_DEPARTMENT_OTHER)
Admission: RE | Admit: 2023-01-20 | Discharge: 2023-01-20 | Disposition: A | Discharge status: Home / Self Care | Payer: BC Managed Care – PPO | Source: Ambulatory Visit | Attending: Family Medicine | Admitting: Family Medicine

## 2023-01-20 ENCOUNTER — Encounter: Payer: Self-pay | Admitting: Family Medicine

## 2023-01-20 ENCOUNTER — Ambulatory Visit: Payer: BC Managed Care – PPO | Admitting: Family Medicine

## 2023-01-20 VITALS — BP 124/69 | HR 80 | Temp 97.4°F | Ht 63.0 in | Wt 229.0 lb

## 2023-01-20 DIAGNOSIS — R52 Pain, unspecified: Secondary | ICD-10-CM | POA: Insufficient documentation

## 2023-01-20 DIAGNOSIS — R0602 Shortness of breath: Secondary | ICD-10-CM | POA: Diagnosis not present

## 2023-01-20 DIAGNOSIS — R509 Fever, unspecified: Secondary | ICD-10-CM | POA: Diagnosis not present

## 2023-01-20 DIAGNOSIS — R051 Acute cough: Secondary | ICD-10-CM

## 2023-01-20 DIAGNOSIS — R059 Cough, unspecified: Secondary | ICD-10-CM | POA: Insufficient documentation

## 2023-01-20 DIAGNOSIS — J45909 Unspecified asthma, uncomplicated: Secondary | ICD-10-CM | POA: Diagnosis not present

## 2023-01-20 DIAGNOSIS — R0981 Nasal congestion: Secondary | ICD-10-CM

## 2023-01-20 DIAGNOSIS — R0600 Dyspnea, unspecified: Secondary | ICD-10-CM | POA: Diagnosis not present

## 2023-01-20 LAB — D-DIMER, QUANTITATIVE: D-Dimer, Quant: 0.23 ug{FEU}/mL (ref <0.50)

## 2023-01-20 MED ORDER — AMOXICILLIN-POT CLAVULANATE 875-125 MG PO TABS: 1.0000 | ORAL_TABLET | Freq: Two times a day (BID) | ORAL | 0 refills | Status: AC

## 2023-01-20 MED ORDER — HYDROCOD POLI-CHLORPHE POLI ER 10-8 MG/5ML PO SUER: 5.0000 mL | Freq: Two times a day (BID) | ORAL | 0 refills | Status: AC | PRN

## 2023-01-20 NOTE — Progress Notes (Signed)
 Acute Office Visit  Subjective:     Patient ID: Laura Warner, female    DOB: May 22, 2000, 23 y.o.   MRN: [id number]  Chief Complaint  Patient presents with   Nasal Congestion    HPI Patient is in today for URI.   Discussed the use of AI scribe software for clinical note transcription with the patient, who gave verbal consent to proceed.  History of Present Illness   The patient, with a known history of lupus, has been experiencing symptoms of a respiratory illness for approximately two weeks, starting the Thursday after Christmas. The initial symptoms included severe coughing, rhinorrhea, congestion, and fever. An e-visit was conducted, and the patient was prescribed azithromycin , prednisone , and a cough suppressant. The patient completed the course of azithromycin  and prednisone  but continued the cough suppressant as needed.  Despite the treatment, the patient reports persistent symptoms, including a sharp pain on the left side of the chest, which worsens at night and during deep breaths. The coughing has continued, and the patient has been experiencing recurrent fevers, with the most recent episode occurring the night before the consultation, reaching a temperature of 101.44F. The patient's brother had a cold around the time of her initial symptoms, but his symptoms were not as severe. The patient has not had a chest x-ray since the onset of the symptoms.            ROS All review of systems negative except what is listed in the HPI      Objective:    BP 124/69   Pulse 80   Temp (!) 97.4 F (36.3 C) (Oral)   Ht 5' 3 (1.6 m)   Wt 229 lb (103.9 kg)   SpO2 100%   BMI 40.57 kg/m    Physical Exam Vitals reviewed.  Constitutional:      General: She is not in acute distress.    Appearance: Normal appearance. She is not ill-appearing.  HENT:     Head: Normocephalic and atraumatic.     Right Ear: Tympanic membrane normal.     Left Ear: Tympanic membrane normal.      Nose: Congestion present. No rhinorrhea.  Eyes:     Conjunctiva/sclera: Conjunctivae normal.  Cardiovascular:     Rate and Rhythm: Normal rate and regular rhythm.  Pulmonary:     Breath sounds: No wheezing or rales.     Comments: diminished Skin:    General: Skin is warm and dry.  Neurological:     Mental Status: She is alert and oriented to person, place, and time.  Psychiatric:        Mood and Affect: Mood normal.        Behavior: Behavior normal.        Thought Content: Thought content normal.        Judgment: Judgment normal.         No results found for any visits on 01/20/23.      Assessment & Plan:   Problem List Items Addressed This Visit   None Visit Diagnoses       Acute cough    -  Primary   Relevant Medications   amoxicillin -clavulanate (AUGMENTIN ) 875-125 MG tablet   chlorpheniramine-HYDROcodone (TUSSIONEX) 10-8 MG/5ML     Nasal congestion       Relevant Medications   amoxicillin -clavulanate (AUGMENTIN ) 875-125 MG tablet     Pain aggravated by coughing and deep breathing       Relevant Orders   DG Chest 2  View   D-Dimer, Quantitative     Shortness of breath       Relevant Orders   DG Chest 2 View   D-Dimer, Quantitative       Persistent cough, congestion, and fever for two weeks despite treatment with azithromycin , prednisone , and cough medicine. Chest pain on the left side, worse at night and with deep breaths. -Order chest x-ray and D-dimer  -Start Augmentin . -Continue Mucinex to thin mucus. -Provide cough syrup with hydrocodone for nighttime use. -Continue supportive measures including rest, hydration, humidifier use, steam showers, warm compresses to sinuses, warm liquids with lemon and honey, and over-the-counter cough, cold, and analgesics as needed.           Meds ordered this encounter  Medications   amoxicillin -clavulanate (AUGMENTIN ) 875-125 MG tablet    Sig: Take 1 tablet by mouth 2 (two) times daily.    Dispense:  20  tablet    Refill:  0    Supervising Provider:   DOMENICA BLACKBIRD A [[id number]]   chlorpheniramine-HYDROcodone (TUSSIONEX) 10-8 MG/5ML    Sig: Take 5 mLs by mouth every 12 (twelve) hours as needed for up to 5 days.    Dispense:  50 mL    Refill:  0    Supervising Provider:   DOMENICA BLACKBIRD A [[id number]]    Return if symptoms worsen or fail to improve.  Waddell KATHEE Mon, NP

## 2023-03-28 ENCOUNTER — Telehealth: Admitting: Family Medicine

## 2023-03-28 DIAGNOSIS — J111 Influenza due to unidentified influenza virus with other respiratory manifestations: Secondary | ICD-10-CM

## 2023-03-28 MED ORDER — ONDANSETRON 4 MG PO TBDP: 4.0000 mg | ORAL_TABLET | Freq: Three times a day (TID) | ORAL | 0 refills | Status: AC | PRN

## 2023-03-28 MED ORDER — OSELTAMIVIR PHOSPHATE 75 MG PO CAPS: 75.0000 mg | ORAL_CAPSULE | Freq: Two times a day (BID) | ORAL | 0 refills | Status: AC

## 2023-03-28 NOTE — Progress Notes (Signed)

## 2023-08-12 DIAGNOSIS — Z79899 Other long term (current) drug therapy: Secondary | ICD-10-CM | POA: Diagnosis not present

## 2023-08-12 DIAGNOSIS — M2559 Pain in other specified joint: Secondary | ICD-10-CM | POA: Diagnosis not present

## 2023-08-12 DIAGNOSIS — R768 Other specified abnormal immunological findings in serum: Secondary | ICD-10-CM | POA: Diagnosis not present

## 2023-08-12 DIAGNOSIS — H5213 Myopia, bilateral: Secondary | ICD-10-CM | POA: Diagnosis not present

## 2023-08-12 DIAGNOSIS — M328 Other forms of systemic lupus erythematosus: Secondary | ICD-10-CM | POA: Diagnosis not present

## 2023-09-01 DIAGNOSIS — M2550 Pain in unspecified joint: Secondary | ICD-10-CM | POA: Diagnosis not present

## 2023-09-01 DIAGNOSIS — Z79899 Other long term (current) drug therapy: Secondary | ICD-10-CM | POA: Diagnosis not present

## 2023-09-01 DIAGNOSIS — R768 Other specified abnormal immunological findings in serum: Secondary | ICD-10-CM | POA: Diagnosis not present

## 2023-09-01 DIAGNOSIS — R21 Rash and other nonspecific skin eruption: Secondary | ICD-10-CM | POA: Diagnosis not present

## 2023-09-03 DIAGNOSIS — L739 Follicular disorder, unspecified: Secondary | ICD-10-CM | POA: Diagnosis not present

## 2023-09-03 DIAGNOSIS — L309 Dermatitis, unspecified: Secondary | ICD-10-CM | POA: Diagnosis not present

## 2023-10-10 ENCOUNTER — Telehealth: Admitting: Family

## 2023-10-10 DIAGNOSIS — L309 Dermatitis, unspecified: Secondary | ICD-10-CM | POA: Diagnosis not present

## 2023-10-10 DIAGNOSIS — B958 Unspecified staphylococcus as the cause of diseases classified elsewhere: Secondary | ICD-10-CM | POA: Diagnosis not present

## 2023-10-10 MED ORDER — MUPIROCIN 2 % EX OINT: 1.0000 | TOPICAL_OINTMENT | Freq: Two times a day (BID) | CUTANEOUS | 0 refills | Status: AC

## 2023-10-10 MED ORDER — CEPHALEXIN 500 MG PO CAPS: 500.0000 mg | ORAL_CAPSULE | Freq: Two times a day (BID) | ORAL | 0 refills | Status: AC

## 2023-10-10 MED ORDER — BETAMETHASONE DIPROPIONATE 0.05 % EX CREA: TOPICAL_CREAM | Freq: Two times a day (BID) | CUTANEOUS | 0 refills | Status: AC

## 2023-10-10 NOTE — Progress Notes (Signed)
 E-Visit for Eczema  We are sorry that you are not feeling well. Here is how we plan to help! Based on what you shared with me it looks like you have eczema (atopic dermatitis).  Although the cause of eczema is not completely understood, genetics appear to play a strong role, and people with a family history of eczema are at increased risk of developing the condition. In most people with eczema, there is a genetic abnormality in the outermost layer of the skin, called the epidermis   Most people with eczema develop their first symptoms as children, before the age of 92. Intense itching of the skin, patches of redness, small bumps, and skin flaking are common. Scratching can further inflame the skin and worsen the itching. The itchiness may be more noticeable at nighttime.  Eczema commonly affects the back of the neck, the elbow creases, and the backs of the knees. Other affected areas may include the face, wrists, and forearms. The skin may become thickened and darkened, or even scarred, from repeated scratching. Eliminating factors that aggravate your eczema symptoms can help to control the symptoms. Possible triggers may include: ? Cold or dry environments ? Sweating ? Emotional stress or anxiety ? Rapid temperature changes ? Exposure to certain chemicals or cleaning solutions, including soaps and detergents, perfumes and cosmetics, wool or synthetic fibers, dust, sand, and cigarette smoke Keeping your skin hydrated Emollients -- Emollients are creams and ointments that moisturize the skin and prevent it from drying out. The best emollients for people with eczema are thick creams (such as Eucerin, Cetaphil, and Nutraderm) or ointments (such as petroleum jelly, Aquaphor, and Vaseline), which contain little to no water. Emollients are most effective when applied immediately after bathing. Emollients can be applied twice a day or more often if needed. Lotions contain more water than creams and  ointments and are less effective for moisturizing the skin. Bathing -- It is not clear if showers or baths are better for keeping the skin hydrated. Lukewarm baths or showers can hydrate and cool the skin, temporarily relieving itching from eczema. An unscented, mild soap or non-soap cleanser (such as Cetaphil) should be used sparingly. Apply an emollient immediately after bathing or showering to prevent your skin from drying out as a result of water evaporation. Emollient bath additives (products you add to the bath water) have not been found to help relieve symptoms. Hot or long baths (more than 10 to 15 minutes) and showers should be avoided since they can dry out the skin.  Based on what you shared with me you may have eczema.   I have prescribed: and Betamethasone ointment (or cream).  Apply to effected areas twice per day  Based on what you shared with me you may have a skin infection.    I have prescribed a topical antibiotic cream called Mupiricin 2%. Apply to effected areas twice per day for two weeks. I have also sent in Keflex  500 mg for three times a day for 7 days.   I recommend dilute bleach baths for people with eczema. These baths help to decrease the number of bacteria on the skin that can cause infections or worsen symptoms. To prepare a bleach bath, one-fourth to one-half cup of bleach is placed in a full bathtub (about 40 gallons) of water. Bleach baths are usually taken for 5 to 10 minutes twice per week and should be followed by application of an emollient (listed above). I recommend you take Benadryl 25mg  - 50mg   every 4 hours to control the symptoms (including itching) but if they last over 24 hours it is best that you see an office based provider for follow up.  HOME CARE: Take lukewarm showers or baths Apply creams and ointments to prevent the skin from drying (Eucerin, Cetaphil, Nutraderm, petroleum jelly, Aquaphor or Vaseline) - these products contain less water than other  lotions and are more effective for moisturizing the skin Limit exposure to cold or dry environments, sweating, emotional stress and anxiety, rapid temperature changes and exposure to chemicals and cleaning products, soaps and detergents, perfumes, cosmetics, wool and synthetic fibers, dust, sand and cigarette- factors which can aggravate eczema symptoms.  Use a hydrocortisone cream once or twice a day Take an antihistamine like Benadryl for widespread rashes that itch.  The adult dosage of Benadryl is 25-50 mg by mouth 4 times daily. Caution: This type of medication may cause sleepiness.  Do not drink alcohol, drive, or operate dangerous machinery while taking antihistamines.  Do not take these medications if you have prostate enlargement.  Read the package instructions thoroughly on all medications that you take.  GET HELP RIGHT AWAY IF: Symptoms that don't go away after treatment. Severe itching that persists. You develop a fever. Your skin begins to drain. You have a sore throat. You become short of breath.  MAKE SURE YOU   Understand these instructions. Will watch your condition. Will get help right away if you are not doing well or get worse.    Thank you for choosing an e-visit.  Your e-visit answers were reviewed by a board certified advanced clinical practitioner to complete your personal care plan. Depending upon the condition, your plan could have included both over the counter or prescription medications.  Please review your pharmacy choice. Make sure the pharmacy is open so you can pick up prescription now. If there is a problem, you may contact your provider through [employer] and have the prescription routed to another pharmacy.  Your safety is important to us . If you have drug allergies check your prescription carefully.   For the next 24 hours you can use MyChart to ask questions about today's visit, request a non-urgent call back, or ask for a work or school  excuse. You will get an email in the next two days asking about your experience. I hope that your e-visit has been valuable and will speed your recovery.

## 2023-10-10 NOTE — Progress Notes (Signed)
Approximately 5 minutes was spent documenting and reviewing patient's chart.
